# Patient Record
Sex: Female | Born: 1990 | Hispanic: No | Marital: Single | Smoking: Current some day smoker
Health system: Southern US, Community
[De-identification: ages and names within clinical notes are randomized; demographics above are authoritative.]

## PROBLEM LIST (undated history)

## (undated) DIAGNOSIS — J45909 Unspecified asthma, uncomplicated: Secondary | ICD-10-CM

## (undated) DIAGNOSIS — J40 Bronchitis, not specified as acute or chronic: Secondary | ICD-10-CM

## (undated) HISTORY — PX: TONSILLECTOMY: SUR1361

---

## 2018-03-28 ENCOUNTER — Encounter (HOSPITAL_COMMUNITY): Payer: Self-pay

## 2018-03-28 ENCOUNTER — Emergency Department (HOSPITAL_COMMUNITY): Payer: BC Managed Care – PPO

## 2018-03-28 ENCOUNTER — Other Ambulatory Visit: Payer: Self-pay

## 2018-03-28 ENCOUNTER — Emergency Department (HOSPITAL_COMMUNITY)
Admission: EM | Admit: 2018-03-28 | Discharge: 2018-03-28 | Disposition: A | Discharge status: Home / Self Care | Payer: BC Managed Care – PPO | Attending: Emergency Medicine | Admitting: Emergency Medicine

## 2018-03-28 DIAGNOSIS — J101 Influenza due to other identified influenza virus with other respiratory manifestations: Secondary | ICD-10-CM | POA: Insufficient documentation

## 2018-03-28 DIAGNOSIS — F17210 Nicotine dependence, cigarettes, uncomplicated: Secondary | ICD-10-CM | POA: Diagnosis not present

## 2018-03-28 DIAGNOSIS — J45909 Unspecified asthma, uncomplicated: Secondary | ICD-10-CM | POA: Diagnosis not present

## 2018-03-28 DIAGNOSIS — R509 Fever, unspecified: Secondary | ICD-10-CM | POA: Diagnosis present

## 2018-03-28 HISTORY — DX: Bronchitis, not specified as acute or chronic: J40

## 2018-03-28 HISTORY — DX: Unspecified asthma, uncomplicated: J45.909

## 2018-03-28 LAB — I-STAT BETA HCG BLOOD, ED (NOT ORDERABLE): I-stat hCG, quantitative: <5 m[IU]/mL (ref <5)

## 2018-03-28 LAB — COMPREHENSIVE METABOLIC PANEL
ALT: 23 U/L (ref 0–44)
AST: 24 U/L (ref 15–41)
Albumin: 3.9 g/dL (ref 3.5–5.0)
Alkaline Phosphatase: 105 U/L (ref 38–126)
Anion gap: 8 (ref 5–15)
BUN: 10 mg/dL (ref 6–20)
CO2: 23 mmol/L (ref 22–32)
Calcium: 9.1 mg/dL (ref 8.9–10.3)
Chloride: 105 mmol/L (ref 98–111)
Creatinine, Ser: 0.83 mg/dL (ref 0.44–1.00)
GFR calc Af Amer: >60 mL/min (ref >60)
Glucose, Bld: 97 mg/dL (ref 70–99)
POTASSIUM: 3.5 mmol/L (ref 3.5–5.1)
Sodium: 136 mmol/L (ref 135–145)
Total Bilirubin: 0.2 mg/dL — ABNORMAL LOW (ref 0.3–1.2)
Total Protein: 7.4 g/dL (ref 6.5–8.1)

## 2018-03-28 LAB — INFLUENZA PANEL BY PCR (TYPE A & B)
Influenza A By PCR: NEGATIVE
Influenza B By PCR: POSITIVE — AB

## 2018-03-28 LAB — URINALYSIS, ROUTINE W REFLEX MICROSCOPIC
Bilirubin Urine: NEGATIVE
Glucose, UA: NEGATIVE mg/dL
Ketones, ur: NEGATIVE mg/dL
Nitrite: NEGATIVE
Protein, ur: NEGATIVE mg/dL
Specific Gravity, Urine: 1.021 (ref 1.005–1.030)
pH: 5 (ref 5.0–8.0)

## 2018-03-28 LAB — CBC
HCT: 38.6 % (ref 36.0–46.0)
Hemoglobin: 11.7 g/dL — ABNORMAL LOW (ref 12.0–15.0)
MCH: 24.3 pg — ABNORMAL LOW (ref 26.0–34.0)
MCHC: 30.3 g/dL (ref 30.0–36.0)
MCV: 80.2 fL (ref 80.0–100.0)
PLATELETS: 304 10*3/uL (ref 150–400)
RBC: 4.81 MIL/uL (ref 3.87–5.11)
RDW: 15.7 % — ABNORMAL HIGH (ref 11.5–15.5)
WBC: 10.1 10*3/uL (ref 4.0–10.5)
nRBC: 0 % (ref 0.0–0.2)

## 2018-03-28 LAB — LIPASE, BLOOD: LIPASE: 34 U/L (ref 11–51)

## 2018-03-28 MED ORDER — SODIUM CHLORIDE 0.9 % IV BOLUS
1000.0000 mL | Freq: Once | INTRAVENOUS | Status: AC
  Administered 2018-03-28: 1000 mL via INTRAVENOUS

## 2018-03-28 MED ORDER — OSELTAMIVIR PHOSPHATE 75 MG PO CAPS: 75.0000 mg | ORAL_CAPSULE | Freq: Two times a day (BID) | ORAL | 0 refills | Status: AC

## 2018-03-28 MED ORDER — IBUPROFEN 800 MG PO TABS
800.0000 mg | ORAL_TABLET | Freq: Once | ORAL | Status: AC
  Administered 2018-03-28: 800 mg via ORAL
  Filled 2018-03-28: qty 1

## 2018-03-28 MED ORDER — SODIUM CHLORIDE 0.9% FLUSH
3.0000 mL | Freq: Once | INTRAVENOUS | Status: AC
  Administered 2018-03-28: 3 mL via INTRAVENOUS

## 2018-03-28 MED ORDER — GUAIFENESIN 100 MG/5ML PO SYRP: 100.0000 mg | ORAL_SOLUTION | ORAL | 0 refills | Status: AC | PRN

## 2018-03-28 NOTE — Discharge Instructions (Addendum)
You have been seen today for flu symptoms. Please read and follow all provided instructions.   1. Medications: Tamiflu, Robitussin for cough, usual home medications 2. Treatment: rest, drink plenty of fluids 3. Follow Up: Please follow up with your primary doctor in 2 days for discussion of your diagnoses and further evaluation after today's visit; if you do not have a primary care doctor use the resource guide provided to find one; Please return to the ER for any new or worsening symptoms. Please obtain all of your results from medical records or have your doctors office obtain the results - share them with your doctor - you should be seen at your doctors office. Call today to arrange your follow up.   Take medications as prescribed. Please review all of the medicines and only take them if you do not have an allergy to them. Return to the emergency room for worsening condition or new concerning symptoms. Follow up with your regular doctor. If you don't have a regular doctor use one of the numbers below to establish a primary care doctor.  Please be aware that if you are taking birth control pills, taking other prescriptions, ESPECIALLY ANTIBIOTICS may make the birth control ineffective - if this is the case, either do not engage in sexual activity or use alternative methods of birth control such as condoms until you have finished the medicine and your family doctor says it is OK to restart them. If you are on a blood thinner such as COUMADIN, be aware that any other medicine that you take may cause the coumadin to either work too much, or not enough - you should have your coumadin level rechecked in next 7 days if this is the case.  ?  It is also a possibility that you have an allergic reaction to any of the medicines that you have been prescribed - Everybody reacts differently to medications and while MOST people have no trouble with most medicines, you may have a reaction such as nausea, vomiting, rash,  swelling, shortness of breath. If this is the case, please stop taking the medicine immediately and contact your physician.  ?  You should return to the ER if you develop severe or worsening symptoms.   Emergency Department Resource Guide 1) Find a Doctor and Pay Out of Pocket Although you won't have to find out who is covered by your insurance plan, it is a good idea to ask around and get recommendations. You will then need to call the office and see if the doctor you have chosen will accept you as a new patient and what types of options they offer for patients who are self-pay. Some doctors offer discounts or will set up payment plans for their patients who do not have insurance, but you will need to ask so you aren't surprised when you get to your appointment.  2) Contact Your Local Health Department Not all health departments have doctors that can see patients for sick visits, but many do, so it is worth a call to see if yours does. If you don't know where your local health department is, you can check in your phone book. The CDC also has a tool to help you locate your state's health department, and many state websites also have listings of all of their local health departments.  3) Find a Walk-in Clinic If your illness is not likely to be very severe or complicated, you may want to try a walk in clinic. These are  popping up all over the country in pharmacies, drugstores, and shopping centers. They're usually staffed by nurse practitioners or physician assistants that have been trained to treat common illnesses and complaints. They're usually fairly quick and inexpensive. However, if you have serious medical issues or chronic medical problems, these are probably not your best option.  No Primary Care Doctor: Call Health Connect at  [phone] - they can help you locate a primary care doctor that  accepts your insurance, provides certain services, etc. Physician Referral [phone]  Emergency Department Resource Guide 1) Find a Doctor and Pay Out of Pocket Although you won't have to find out who is covered by your insurance plan, it is a good idea to ask around and get recommendations. You will then need to call the office and see if the doctor you have chosen will accept you as a new patient and what types of options they offer for patients who are self-pay. Some doctors offer discounts or will set up payment plans for their patients who do not have insurance, but you will need to ask so you aren't surprised when you get to your appointment.  2) Contact Your Local Health Department Not all health departments have doctors that can see patients for sick visits, but many do, so it is worth a call to see if yours does. If you don't know where your local health department is, you can check in your phone book. The CDC also has a tool to help you locate your state's health department, and many state websites also have listings of all of their local health departments.  3) Find a [facility] If your illness is not likely to be very severe or complicated, you may want to try a walk in clinic. These are popping up all over the country in pharmacies, drugstores, and shopping centers. They're usually staffed by nurse practitioners or physician assistants that have been trained to treat common illnesses and complaints. They're usually fairly quick and inexpensive. However, if you have serious medical issues or chronic medical problems, these are probably not your best option.  No Primary Care Doctor: Call Health Connect at  [phone] - they can help you locate a primary care doctor that  accepts your insurance, provides certain services, etc. Physician Referral Service- [phone]  Chronic Pain Problems: Organization         Address  Phone   Notes  [facility]  [phone] Patients need to be referred by their primary care doctor.    Medication Assistance: Organization         Address  Phone   Notes  [facility] Medication [facility] [address] [place], [place] [address] [phone] --Must be a resident of [facility] -- Must have NO insurance coverage whatsoever (no Medicaid/ Medicare, etc.) -- The pt. MUST have a primary care doctor that directs their care regularly and follows them in the community   [facility]  [phone]   [employer]  [phone]    Agencies that provide inexpensive medical care: Organization         Address  Phone   Notes  [place]  [phone]   [facility]    [phone]   [facility], [place] [address] [phone]   [place] [address] [place] [phone]   [facility]    [phone])  [facility]    [phone]   [address], [place] Phone:  [phone], Fax:  [phone] Hours of Operation:  9 am - 6 pm, M-F.  Also accepts Medicaid/Medicare and self-pay.  [facility] for [address] [address], [address], [place] Phone: [phone], Fax: [phone]. Hours of Operation:  8:30 am - 5:30 pm, M-F.  Also accepts Medicaid and self-pay.  [facility] [address] [place] Phone: [phone]   [place], [place], [place] [phone], Ext. [phone] Mondays & Thursdays: 7-9 AM.  First 15 patients are seen on a first come, first serve basis.    [place] Providers:  Organization         Address  Phone   Notes  [facility] [address], [place] [phone] Also accepts self-pay patients.  [facility] [address], [place]  [phone]   [place], [address], [place] [phone]   [facility] [address], [place] [phone]   Lucianne Lei [address], [place]   [phone] Only accepts [place] [facility] [place] patients after they have their name applied to their card.   Self-Pay (no insurance) in [facility]:  Organization         Address  Phone   Notes  Sickle Cell Patients, [facility] [place] [phone]   [facility] [place] [phone]   [facility] [place]  [place], [place], [place] [phone]   [facility]/Dr. Osei-Bonsu  [address], [place] or [address], [place] [phone] Phone number for both [place] and [place] locations is the same.  [facility] and [facility] [address] [place] [phone]   [facility] [address] [place] or [address] [phone]   [facility] [address], [place] [phone], phone; [phone], fax Sees patients 1st and 3rd Saturday of every month.  Must not qualify for public or private insurance (i.e. Medicaid, Medicare, [place] [employer], Veterans' Benefits)  Household income should be no more than 200% of the poverty level The clinic cannot treat you if you are pregnant or think you are pregnant  Sexually transmitted diseases are not treated at the clinic.

## 2018-03-28 NOTE — ED Provider Notes (Signed)
[facility]-EMERGENCY DEPT Provider Note   CSN: [id number] Arrival date & time: 03/28/18  1713     History   Chief Complaint Chief Complaint  Patient presents with  . Emesis  . Fever    HPI Denise Dean is a 28 y.o. female with a PMH of Asthma presenting with fever of 102, cough, rhinorrhea, sore throat, body aches, and congestion onset today. Patient states she had two episodes of vomiting at work today. Patient reports a mild frontal headache that she describes as pressure. Patient states headache is not different than previous headaches. Patient reports sick contacts at work. Patient denies recent travel. Patient reports taking tylenol, nyquil, and dayquil at 1pm with some relief. Patient reports taking emergen-c without relief. Patient denies abdominal pain or diarrhea. Patient denies nausea currently.   HPI  Past Medical History:  Diagnosis Date  . Asthma   . Bronchitis     There are no active problems to display for this patient.   Past Surgical History:  Procedure Laterality Date  . TONSILLECTOMY       OB History   No obstetric history on file.      Home Medications    Prior to Admission medications   Medication Sig Start Date End Date Taking? Authorizing Provider  acetaminophen (TYLENOL) 500 MG tablet Take 1,000 mg by mouth every 6 (six) hours as needed for mild pain, fever or headache.   Yes [provider]  albuterol (PROVENTIL HFA;VENTOLIN HFA) 108 (90 Base) MCG/ACT inhaler Inhale 2 puffs into the lungs every 6 (six) hours as needed for wheezing or shortness of breath.   Yes [provider]  Multiple Vitamins-Minerals (EMERGEN-C FIVE) PACK Take 1 Package by mouth as needed (flu symptoms).   Yes [provider]  guaifenesin (ROBITUSSIN) 100 MG/5ML syrup Take 5-10 mLs (100-200 mg total) by mouth every 4 (four) hours as needed for cough. 03/28/18   Carlyle Basques P, PA-C  oseltamivir (TAMIFLU) 75 MG capsule Take  1 capsule (75 mg total) by mouth every 12 (twelve) hours. 03/28/18   Leretha Dykes, PA-C    Family History No family history on file.  Social History Social History   Tobacco Use  . Smoking status: Current Some Day Smoker    Types: Cigarettes  . Smokeless tobacco: Never Used  Substance Use Topics  . Alcohol use: Yes    Comment: socially  . Drug use: Never     Allergies   Patient has no known allergies.   Review of Systems Review of Systems  Constitutional: Positive for fever. Negative for activity change, appetite change and fatigue.  HENT: Positive for congestion, rhinorrhea and sore throat. Negative for ear pain and postnasal drip.   Eyes: Negative for pain, redness, itching and visual disturbance.  Respiratory: Positive for cough and chest tightness. Negative for shortness of breath and wheezing.   Cardiovascular: Negative for chest pain.  Gastrointestinal: Positive for vomiting. Negative for abdominal pain, diarrhea and nausea.  Genitourinary: Negative for dysuria and frequency.  Musculoskeletal: Positive for myalgias. Negative for neck pain.  Skin: Negative for rash.  Allergic/Immunologic: Negative for environmental allergies.  Neurological: Positive for headaches. Negative for dizziness and weakness.    Physical Exam Updated Vital Signs BP 107/65 (BP Location: Left Arm)   Pulse 75   Temp 99.4 F (37.4 C) (Oral)   Resp 19   Ht 5\' 5"  (1.651 m)   Wt 127 kg   LMP 03/28/2018   SpO2 100%  BMI 46.59 kg/m   Physical Exam Vitals signs and nursing note reviewed.  Constitutional:      General: She is not in acute distress.    Appearance: She is well-developed. She is not diaphoretic.  HENT:     Head: Normocephalic and atraumatic.     Right Ear: Tympanic membrane, ear canal and external ear normal. No middle ear effusion.     Left Ear: Tympanic membrane, ear canal and external ear normal.  No middle ear effusion.     Nose: Mucosal edema, congestion and  rhinorrhea present.     Mouth/Throat:     Mouth: Mucous membranes are dry.     Pharynx: Uvula midline. Posterior oropharyngeal erythema present. No oropharyngeal exudate.  Eyes:     General:        Right eye: No discharge.        Left eye: No discharge.     Extraocular Movements: Extraocular movements intact.     Conjunctiva/sclera: Conjunctivae normal.     Pupils: Pupils are equal, round, and reactive to light.  Neck:     Musculoskeletal: Normal range of motion and neck supple.  Cardiovascular:     Rate and Rhythm: Normal rate and regular rhythm.     Heart sounds: Normal heart sounds. No murmur. No friction rub. No gallop.   Pulmonary:     Effort: Pulmonary effort is normal. No respiratory distress.     Breath sounds: Normal breath sounds. No wheezing or rales.  Abdominal:     Palpations: Abdomen is soft.     Tenderness: There is no abdominal tenderness. There is no guarding or rebound.  Musculoskeletal: Normal range of motion.  Lymphadenopathy:     Cervical: No cervical adenopathy.  Skin:    General: Skin is warm.     Findings: No rash.  Neurological:     Mental Status: She is alert and oriented to person, place, and time.    Mental Status:  Alert, oriented, thought content appropriate, able to give a coherent history. Speech fluent without evidence of aphasia. Able to follow 2 step commands without difficulty.  Cranial Nerves:  II:  Peripheral visual fields grossly normal, pupils equal, round, reactive to light III,IV, VI: ptosis not present, extra-ocular motions intact bilaterally  V,VII: smile symmetric, facial light touch sensation equal VIII: hearing grossly normal to voice  X: uvula elevates symmetrically  XI: bilateral shoulder shrug symmetric and strong XII: midline tongue extension without fassiculations Motor:  Normal tone. 5/5 in upper and lower extremities bilaterally including strong and equal grip strength and dorsiflexion/plantar flexion Sensory: Pinprick  and light touch normal in all extremities.  Deep Tendon Reflexes: 2+ and symmetric in the biceps and patella Cerebellar: normal finger-to-nose with bilateral upper extremities Gait: normal gait and balance.  CV: distal pulses palpable throughout    ED Treatments / Results  Labs (all labs ordered are listed, but only abnormal results are displayed) Labs Reviewed  COMPREHENSIVE METABOLIC PANEL - Abnormal; Notable for the following components:      Result Value   Total Bilirubin 0.2 (*)    All other components within normal limits  CBC - Abnormal; Notable for the following components:   Hemoglobin 11.7 (*)    MCH 24.3 (*)    RDW 15.7 (*)    All other components within normal limits  URINALYSIS, ROUTINE W REFLEX MICROSCOPIC - Abnormal; Notable for the following components:   APPearance HAZY (*)    Hgb urine dipstick LARGE (*)  Leukocytes, UA TRACE (*)    Bacteria, UA RARE (*)    All other components within normal limits  INFLUENZA PANEL BY PCR (TYPE A & B) - Abnormal; Notable for the following components:   Influenza B By PCR POSITIVE (*)    All other components within normal limits  URINE CULTURE  LIPASE, BLOOD  I-STAT BETA HCG BLOOD, ED ([facility], [facility], [facility] ONLY)  I-STAT BETA HCG BLOOD, ED (NOT ORDERABLE)    EKG None  Radiology Dg Chest 2 View  Result Date: 03/28/2018 CLINICAL DATA:  28 year old female with cough and fever. EXAM: CHEST - 2 VIEW COMPARISON:  None. FINDINGS: Normal lung volumes and mediastinal contours. Visualized tracheal air column is within normal limits. Diffuse increased pulmonary interstitial opacity in both lungs. No pneumothorax, pleural effusion or consolidation. Negative visible bowel gas pattern. Negative visible osseous structures. IMPRESSION: Diffuse pulmonary interstitial opacity compatible with acute viral/atypical respiratory infection in this clinical setting. No pleural effusion. Electronically Signed   By: Odessa FlemingH  Hall M.D.   On: 03/28/2018 18:08     Procedures Procedures (including critical care time)  Medications Ordered in ED Medications  sodium chloride flush (NS) 0.9 % injection 3 mL (3 mLs Intravenous Given 03/28/18 2019)  sodium chloride 0.9 % bolus 1,000 mL (0 mLs Intravenous Stopped 03/28/18 2119)  ibuprofen (ADVIL,MOTRIN) tablet 800 mg (800 mg Oral Given 03/28/18 2012)     Initial Impression / Assessment and Plan / ED Course  I have reviewed the triage vital signs and the nursing notes.  Pertinent labs & imaging results that were available during my care of the patient were reviewed by me and considered in my medical decision making (see chart for details).  Clinical Course as of Mar 28 2205  Wed Mar 28, 2018  1913 Diffuse pulmonary interstitial opacity present likely due to an acute viral infection. No pleural effusion noted on CXR.     DG Chest 2 View [AH]  1914 WBCs within normal limits.  WBC: 10.1 [AH]  2114 Positive for influenza B.  Influenza B By PCR(!): POSITIVE [AH]  2200 Trace of leukocytes, bacteria, and hgb noted on UA. Will order urine culture.   Leukocytes, UA(!): TRACE [AH]    Clinical Course User Index [AH] Carlyle BasquesHernandez, Terryl Molinelli P, PA-C   Patients symptoms are consistent with URI, likely viral etiology. Influenza test is positive. Provided IVF and controlled symptoms while in the ER. Pt CXR reveals diffuse pulmonary interstitial opacity consistent with a viral infection.  Discussed that antibiotics are not indicated for viral infections. Pt will be discharged with symptomatic treatment.  Discussed risks and benefits of Tamiflu. Patient requested to start Tamiflu. Verbalizes understanding and is agreeable with plan. Pt is hemodynamically stable & in NAD prior to dc.  Final Clinical Impressions(s) / ED Diagnoses   Final diagnoses:  Influenza B    ED Discharge Orders         Ordered    oseltamivir (TAMIFLU) 75 MG capsule  Every 12 hours     03/28/18 2206    guaifenesin (ROBITUSSIN) 100 MG/5ML syrup   Every 4 hours PRN     03/28/18 2206           Leretha DykesHernandez, Random Dobrowski P, [place]PA-C 03/28/18 2207    Mancel BaleWentz, Elliott, MD 03/28/18 2359

## 2018-03-28 NOTE — ED Triage Notes (Signed)
Pt states that she had a fever at work today. Pt states that she was coughing, emesis x 2, headache, congestion. Fever was 102, took day-quil, nyquil and tylenol Pt states she tried taking emergen-c's without relief.

## 2018-03-30 LAB — URINE CULTURE

## 2019-12-23 ENCOUNTER — Emergency Department (HOSPITAL_COMMUNITY)
Admission: EM | Admit: 2019-12-23 | Discharge: 2019-12-23 | Disposition: A | Discharge status: Home / Self Care | Payer: BC Managed Care – PPO | Attending: Emergency Medicine | Admitting: Emergency Medicine

## 2019-12-23 ENCOUNTER — Encounter (HOSPITAL_COMMUNITY): Payer: Self-pay

## 2019-12-23 ENCOUNTER — Emergency Department (HOSPITAL_COMMUNITY): Payer: BC Managed Care – PPO

## 2019-12-23 DIAGNOSIS — J45909 Unspecified asthma, uncomplicated: Secondary | ICD-10-CM | POA: Insufficient documentation

## 2019-12-23 DIAGNOSIS — R221 Localized swelling, mass and lump, neck: Secondary | ICD-10-CM | POA: Insufficient documentation

## 2019-12-23 DIAGNOSIS — M542 Cervicalgia: Secondary | ICD-10-CM | POA: Diagnosis present

## 2019-12-23 DIAGNOSIS — L03221 Cellulitis of neck: Secondary | ICD-10-CM | POA: Insufficient documentation

## 2019-12-23 DIAGNOSIS — F17210 Nicotine dependence, cigarettes, uncomplicated: Secondary | ICD-10-CM | POA: Insufficient documentation

## 2019-12-23 DIAGNOSIS — K029 Dental caries, unspecified: Secondary | ICD-10-CM | POA: Diagnosis not present

## 2019-12-23 DIAGNOSIS — L0390 Cellulitis, unspecified: Secondary | ICD-10-CM

## 2019-12-23 LAB — CBC WITH DIFFERENTIAL/PLATELET
Abs Immature Granulocytes: 0.03 10*3/uL (ref 0.00–0.07)
Basophils Absolute: 0.1 10*3/uL (ref 0.0–0.1)
Basophils Relative: 1 %
Eosinophils Absolute: 0.3 10*3/uL (ref 0.0–0.5)
Eosinophils Relative: 3 %
HCT: 34 % — ABNORMAL LOW (ref 36.0–46.0)
Hemoglobin: 9.7 g/dL — ABNORMAL LOW (ref 12.0–15.0)
Immature Granulocytes: 0 %
Lymphocytes Relative: 44 %
Lymphs Abs: 4.4 10*3/uL — ABNORMAL HIGH (ref 0.7–4.0)
MCH: 20.3 pg — ABNORMAL LOW (ref 26.0–34.0)
MCHC: 28.5 g/dL — ABNORMAL LOW (ref 30.0–36.0)
MCV: 71.1 fL — ABNORMAL LOW (ref 80.0–100.0)
Monocytes Absolute: 0.9 10*3/uL (ref 0.1–1.0)
Monocytes Relative: 9 %
Neutro Abs: 4.3 10*3/uL (ref 1.7–7.7)
Neutrophils Relative %: 43 %
Platelets: 455 10*3/uL — ABNORMAL HIGH (ref 150–400)
RBC: 4.78 MIL/uL (ref 3.87–5.11)
RDW: 17 % — ABNORMAL HIGH (ref 11.5–15.5)
WBC: 9.9 10*3/uL (ref 4.0–10.5)
nRBC: 0 % (ref 0.0–0.2)

## 2019-12-23 LAB — I-STAT BETA HCG BLOOD, ED (MC, WL, AP ONLY): I-stat hCG, quantitative: <5 m[IU]/mL (ref <5)

## 2019-12-23 LAB — I-STAT CHEM 8, ED
BUN: 11 mg/dL (ref 6–20)
Calcium, Ion: 1.23 mmol/L (ref 1.15–1.40)
Chloride: 104 mmol/L (ref 98–111)
Creatinine, Ser: 0.9 mg/dL (ref 0.44–1.00)
Glucose, Bld: 86 mg/dL (ref 70–99)
HCT: 33 % — ABNORMAL LOW (ref 36.0–46.0)
Hemoglobin: 11.2 g/dL — ABNORMAL LOW (ref 12.0–15.0)
Potassium: 3.9 mmol/L (ref 3.5–5.1)
Sodium: 139 mmol/L (ref 135–145)
TCO2: 27 mmol/L (ref 22–32)

## 2019-12-23 MED ORDER — AMOXICILLIN-POT CLAVULANATE 875-125 MG PO TABS: 1.0000 | ORAL_TABLET | Freq: Two times a day (BID) | ORAL | 0 refills | Status: AC

## 2019-12-23 MED ORDER — IOHEXOL 300 MG/ML  SOLN
60.0000 mL | Freq: Once | INTRAMUSCULAR | Status: AC | PRN
  Administered 2019-12-23: 60 mL via INTRAVENOUS

## 2019-12-23 NOTE — ED Triage Notes (Signed)
Pt reports 1 week of right sided neck/throat pain, pt noticed swelling that started yesterday. Pt sent from UC for further evaluation of possible abscess. Pt had a negative strept test done there. Pt maintaining secretions, some difficulty swallowing.

## 2019-12-23 NOTE — ED Provider Notes (Addendum)
[facility] EMERGENCY DEPARTMENT Provider Note   CSN: [id number] Arrival date & time: 12/23/19  1223     History Chief Complaint  Patient presents with  . Neck Pain  . Sore Throat    Khilynn Borntreger is a 29 y.o. female.  The history is provided by the patient.  Illness Location:  R angle of jaw, R neck Quality:  Pain and swelling Severity:  Moderate Onset quality:  Gradual Duration:  1 week Timing:  Constant Progression:  Worsening Chronicity:  New Relieved by:  Nothing Worsened by:  Nothing Associated symptoms: sore throat   Associated symptoms: no abdominal pain, no chest pain, no cough, no ear pain, no fever, no rash, no shortness of breath and no vomiting        Past Medical History:  Diagnosis Date  . Asthma   . Bronchitis     There are no problems to display for this patient.   Past Surgical History:  Procedure Laterality Date  . TONSILLECTOMY       OB History   No obstetric history on file.     History reviewed. No pertinent family history.  Social History   Tobacco Use  . Smoking status: Current Some Day Smoker    Types: Cigarettes  . Smokeless tobacco: Never Used  Substance Use Topics  . Alcohol use: Yes    Comment: socially  . Drug use: Never    Home Medications Prior to Admission medications   Medication Sig Start Date End Date Taking? Authorizing Provider  acetaminophen (TYLENOL) 500 MG tablet Take 1,000 mg by mouth every 6 (six) hours as needed for mild pain, fever or headache.    [provider]  albuterol (PROVENTIL HFA;VENTOLIN HFA) 108 (90 Base) MCG/ACT inhaler Inhale 2 puffs into the lungs every 6 (six) hours as needed for wheezing or shortness of breath.    [provider]  amoxicillin-clavulanate (AUGMENTIN) 875-125 MG tablet Take 1 tablet by mouth every 12 (twelve) hours for 10 days. 12/23/19 01/02/20  Loletha Carrow, MD  guaifenesin (ROBITUSSIN) 100 MG/5ML syrup Take 5-10 mLs (100-200  mg total) by mouth every 4 (four) hours as needed for cough. 03/28/18   Carlyle Basques P, PA-C  Multiple Vitamins-Minerals (EMERGEN-C FIVE) PACK Take 1 Package by mouth as needed (flu symptoms).    [provider]  oseltamivir (TAMIFLU) 75 MG capsule Take 1 capsule (75 mg total) by mouth every 12 (twelve) hours. 03/28/18   Leretha Dykes, PA-C    Allergies    Patient has no known allergies.  Review of Systems   Review of Systems  Constitutional: Negative for chills and fever.  HENT: Positive for sore throat, trouble swallowing and voice change. Negative for ear pain.   Eyes: Negative for pain and visual disturbance.  Respiratory: Negative for cough and shortness of breath.   Cardiovascular: Negative for chest pain and palpitations.  Gastrointestinal: Negative for abdominal pain and vomiting.  Genitourinary: Negative for dysuria and hematuria.  Musculoskeletal: Negative for arthralgias and back pain.  Skin: Negative for color change and rash.  Neurological: Negative for seizures and syncope.  All other systems reviewed and are negative.   Physical Exam Updated Vital Signs BP 121/83 (BP Location: Right Arm)   Pulse 63   Temp 98 F (36.7 C)   Resp 17   SpO2 98%   Physical Exam Vitals and nursing note reviewed.  Constitutional:      Appearance: She is obese. She is not ill-appearing, toxic-appearing  or diaphoretic.  HENT:     Head: Normocephalic and atraumatic.     Jaw: Tenderness (around angle of R jaw) present.     Salivary Glands: Right salivary gland is tender. Right salivary gland is not diffusely enlarged. Left salivary gland is not diffusely enlarged or tender.     Comments: Tenderness along R jaw angle with some mild induration. No fluctuance.    Right Ear: Tympanic membrane and external ear normal. No mastoid tenderness.     Left Ear: Tympanic membrane and external ear normal. No mastoid tenderness.     Mouth/Throat:     Mouth: No angioedema.     Dentition:  Dental caries present. No dental tenderness or dental abscesses.     Pharynx: Oropharynx is clear. No oropharyngeal exudate or posterior oropharyngeal erythema.     Tonsils: No tonsillar exudate or tonsillar abscesses.     Comments: Floor of mouth soft Eyes:     Conjunctiva/sclera: Conjunctivae normal.  Neck:     Thyroid: No thyroid tenderness.     Trachea: No tracheal tenderness.  Cardiovascular:     Rate and Rhythm: Normal rate and regular rhythm.     Heart sounds: No murmur heard.  No gallop.   Pulmonary:     Effort: Pulmonary effort is normal. No respiratory distress.     Breath sounds: Normal breath sounds.  Abdominal:     Palpations: Abdomen is soft.     Tenderness: There is no abdominal tenderness.  Musculoskeletal:     Cervical back: Neck supple. No edema, erythema or rigidity. Normal range of motion.  Skin:    General: Skin is warm and dry.  Neurological:     Mental Status: She is alert.     ED Results / Procedures / Treatments   Labs (all labs ordered are listed, but only abnormal results are displayed) Labs Reviewed  CBC WITH DIFFERENTIAL/PLATELET - Abnormal; Notable for the following components:      Result Value   Hemoglobin 9.7 (*)    HCT 34.0 (*)    MCV 71.1 (*)    MCH 20.3 (*)    MCHC 28.5 (*)    RDW 17.0 (*)    Platelets 455 (*)    Lymphs Abs 4.4 (*)    All other components within normal limits  I-STAT CHEM 8, ED - Abnormal; Notable for the following components:   Hemoglobin 11.2 (*)    HCT 33.0 (*)    All other components within normal limits  I-STAT BETA HCG BLOOD, ED ([facility], [facility], [facility] ONLY)    EKG None  Radiology CT Soft Tissue Neck W Contrast  Result Date: 12/23/2019 CLINICAL DATA:  Lymphadenopathy. Neck abscess. Swelling. Sore throat. EXAM: CT NECK WITH CONTRAST TECHNIQUE: Multidetector CT imaging of the neck was performed using the standard protocol following the bolus administration of intravenous contrast. CONTRAST:  42mL OMNIPAQUE IOHEXOL  300 MG/ML  SOLN COMPARISON:  None. FINDINGS: Pharynx and larynx: No mucosal or submucosal lesion. No evidence of tonsillitis or other regional inflammatory disease. Salivary glands: Parotid and submandibular glands are normal. Few small bilateral parotid lymph nodes. Thyroid: Normal Lymph nodes: No enlarged or low-density nodes on either side of the neck. Bilateral normal appearing cervical nodes. Vascular: Normal Limited intracranial: Normal Visualized orbits: Normal Mastoids and visualized paranasal sinuses: Clear Skeleton: Normal Upper chest: Normal Other: No evidence of soft tissue inflammatory disease elsewhere. IMPRESSION: Negative CT scan of the neck. No evidence of mass, lymphadenopathy, tonsillitis or other regional inflammatory disease.  Electronically Signed   By: Paulina Fusi M.D.   On: 12/23/2019 15:04    Procedures Procedures (including critical care time)  Medications Ordered in ED Medications  iohexol (OMNIPAQUE) 300 MG/ML solution 60 mL (60 mLs Intravenous Contrast Given 12/23/19 1457)    ED Course  I have reviewed the triage vital signs and the nursing notes.  Pertinent labs & imaging results that were available during my care of the patient were reviewed by me and considered in my medical decision making (see chart for details).    MDM Rules/Calculators/A&P                          The patient is a 29yo female, PMH asthma who presents to the ED from urgent care for concern for abscess.  On my initial evaluation, the patient is hemodynamically stable, afebrile, nontoxic-appearing. Physical exam remarkable for R neck and angle of jaw tenderness, induration.  Differentials considered include Ludwigs, PTA, RPA, parotitis, dental abscess, mastoiditis, otitis. CT neck obtained prior to my evaluation unremarkable, no evidence of drainable abscesses, RPA, Ludwig's per my interpretation as well as per radiology evaluation.  Labs unremarkable, no leukocytosis, patient is anemic but  does not require transfusion.  With reassuring CT scan, low suspicion for other emergent etiology.  Patient tolerating secretions on my evaluation.  Patient was tender over the right parotid gland area, swelling possibly just some lymphadenopathy or some parotid inflammation.  Patient reports swelling is actually improved since yesterday.  Will treat with antibiotics and recommend outpatient ENT follow-up.  Advised patient of unclear etiology of symptoms. Advised treatment of symptoms with Augmentin, which were prescribed. Recommended follow-up with PCP in the next couple days, and provided contact information for ENT follow-up.  Work note provided.  Strict return precautions provided. Patient discharged in stable condition.   The care of this patient was overseen by Dr. Rosalia Hammers, who agreed with evaluation and plan of care.   Final Clinical Impression(s) / ED Diagnoses Final diagnoses:  Cellulitis, unspecified cellulitis site  Neck swelling    Rx / DC Orders ED Discharge Orders         Ordered    amoxicillin-clavulanate (AUGMENTIN) 875-125 MG tablet  Every 12 hours        12/23/19 1603            Loletha Carrow, MD 12/23/19 1616    Margarita Grizzle, MD 12/24/19 1320

## 2021-12-09 IMAGING — CT CT NECK W/ CM
5 of 6 series · 14 of 35 positions shown, 16 images · IV contrast (APPLIED)
Comparison: None.

CLINICAL DATA: Lymphadenopathy. Neck abscess. Swelling. Sore
throat.

EXAM:
CT NECK WITH CONTRAST
TECHNIQUE: Multidetector CT imaging of the neck was performed using the
standard protocol following the bolus administration of intravenous
contrast.
CONTRAST:  60mL OMNIPAQUE IOHEXOL 300 MG/ML  SOLN

[Series 3: axial neck · axial · 0.59mm/px · z∈[+1105,+1185]mm · 2 of 122 slices shown, 3 images]
[im 41/122  soft-tissue]
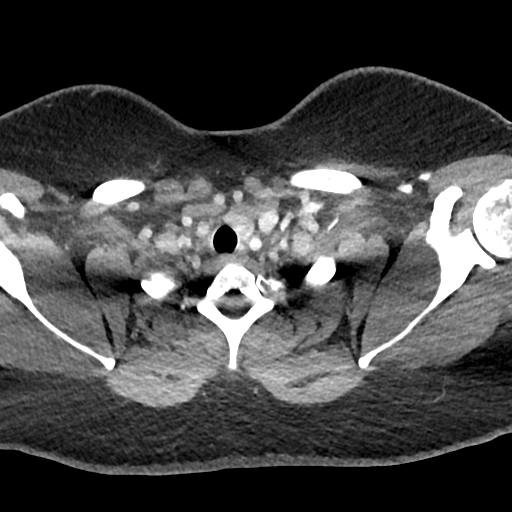
[im 41/122  bone]
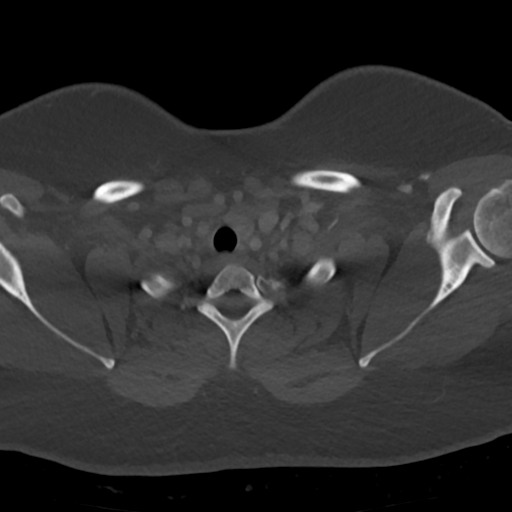
[im 81/122  bone]
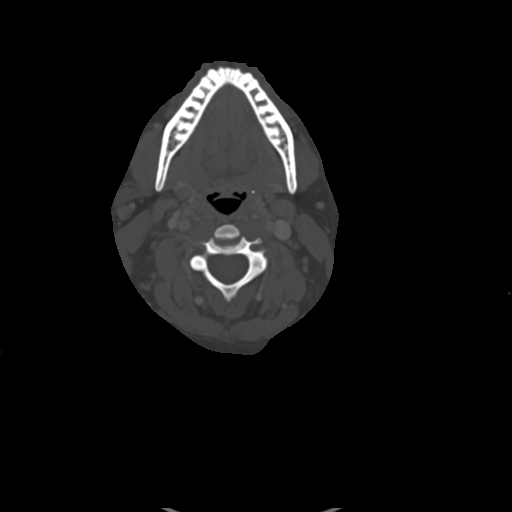

[Series 5: axial bone · axial · 0.59mm/px · z∈[+1105,+1185]mm · 2 of 122 slices shown]
[im 41/122  bone]
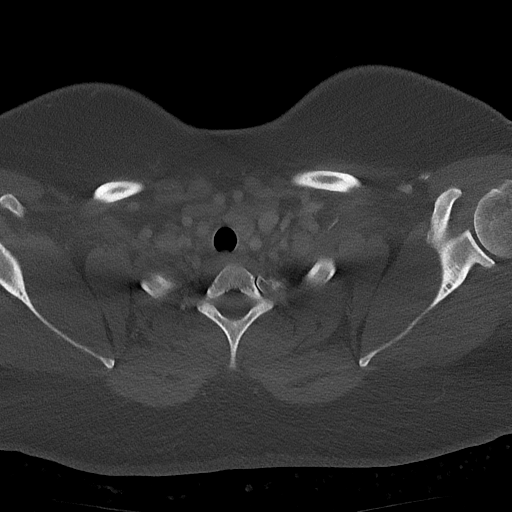
[im 81/122  bone]
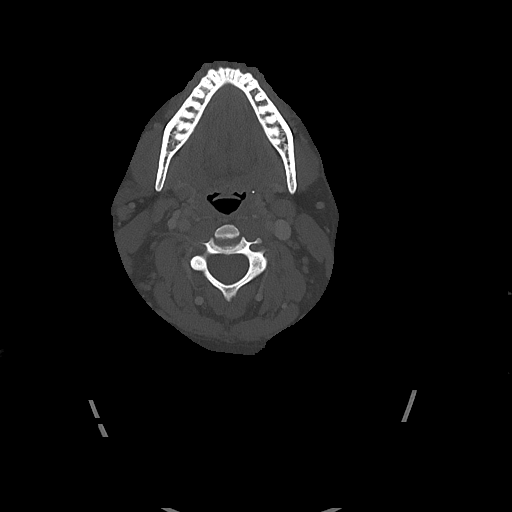

[Series 6: sag neck · sagittal · 0.49mm/px · 5 of 249 slices shown, 6 images]
[im 83/249  bone]
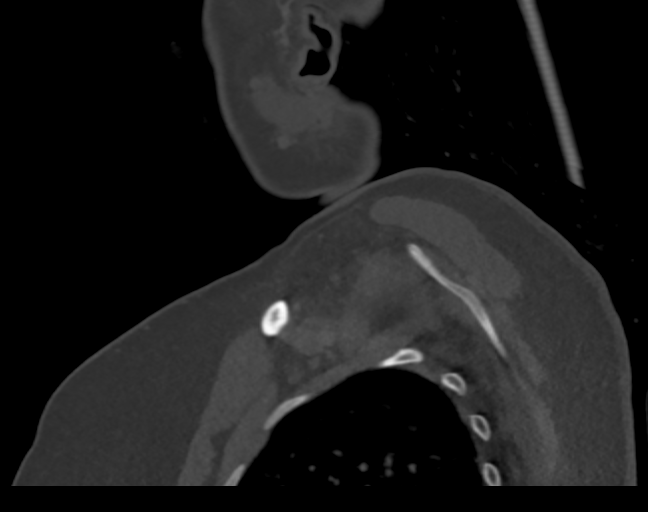
[im 104/249  bone]
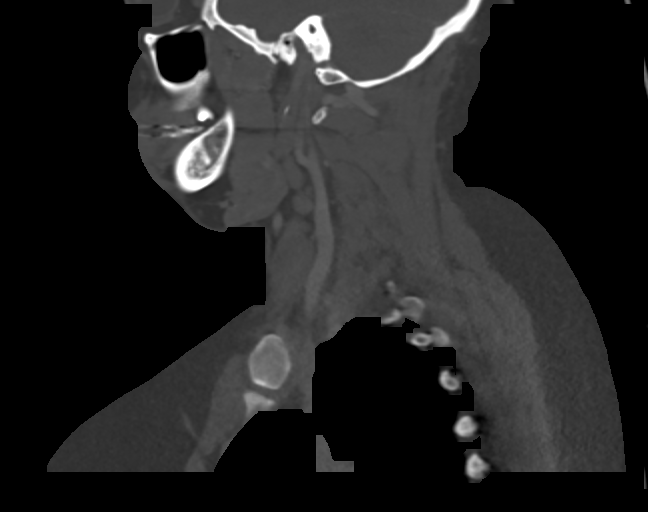
[im 125/249  soft-tissue]
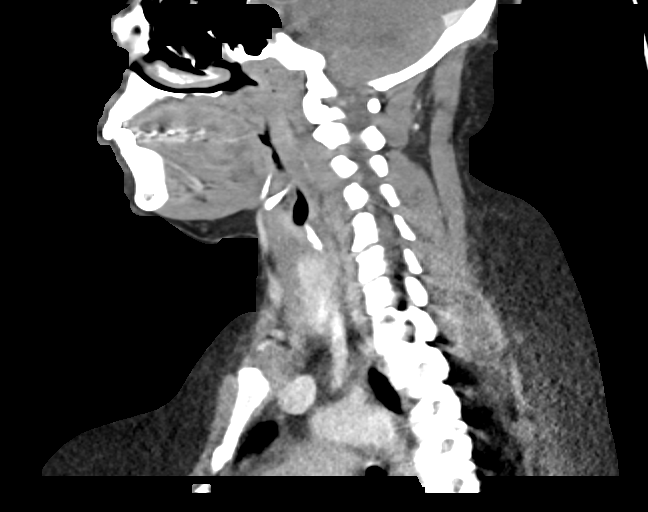
[im 125/249  bone]
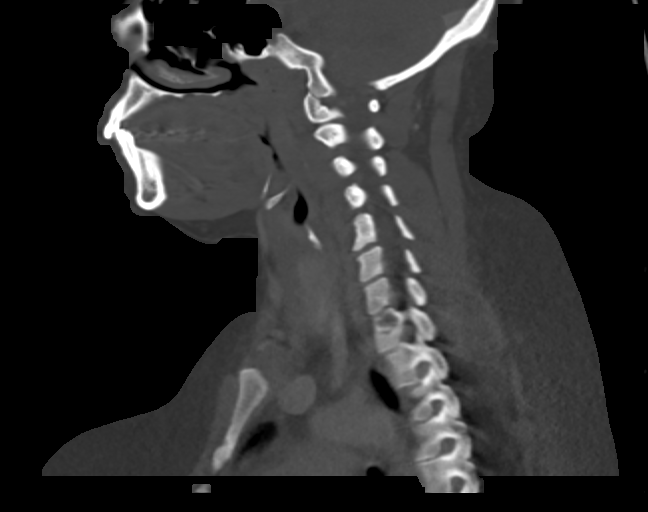
[im 145/249  bone]
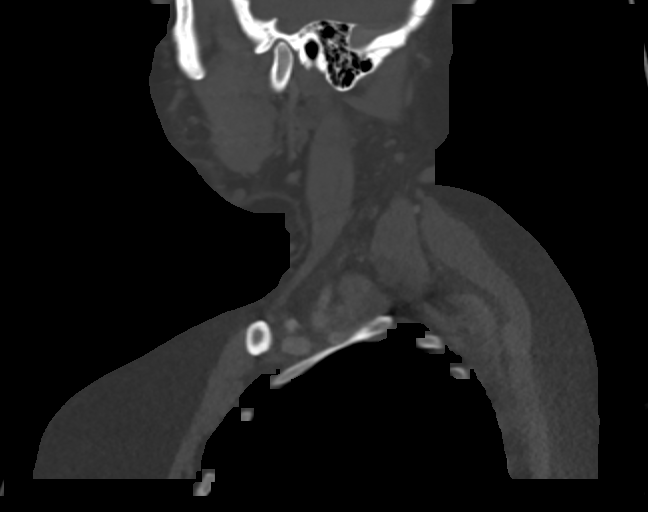
[im 166/249  bone]
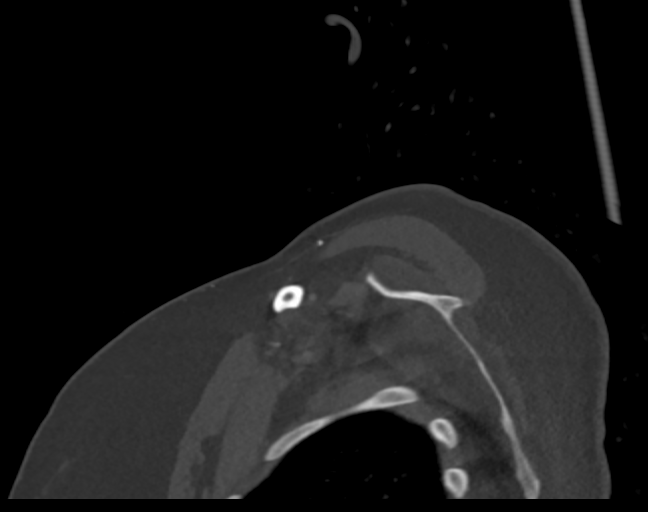

[Series 7: cor neck · coronal · 0.49mm/px · 3 of 158 slices shown]
[im 32/158  bone]
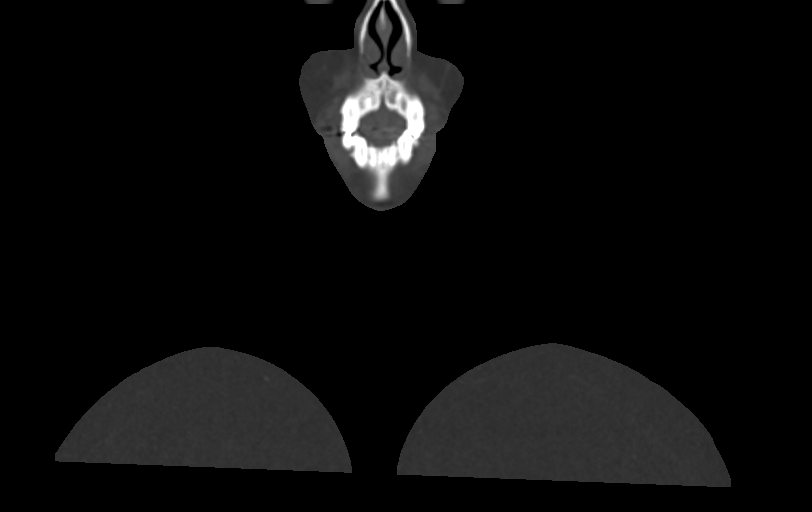
[im 63/158  bone]
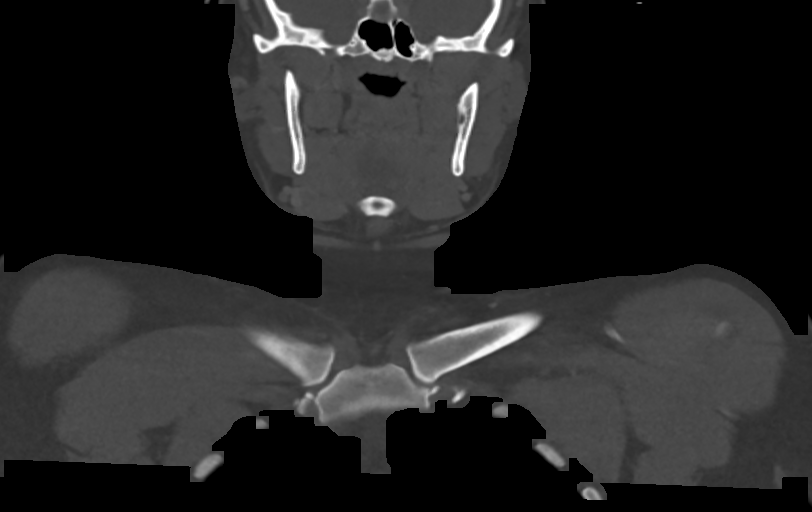
[im 95/158  bone]
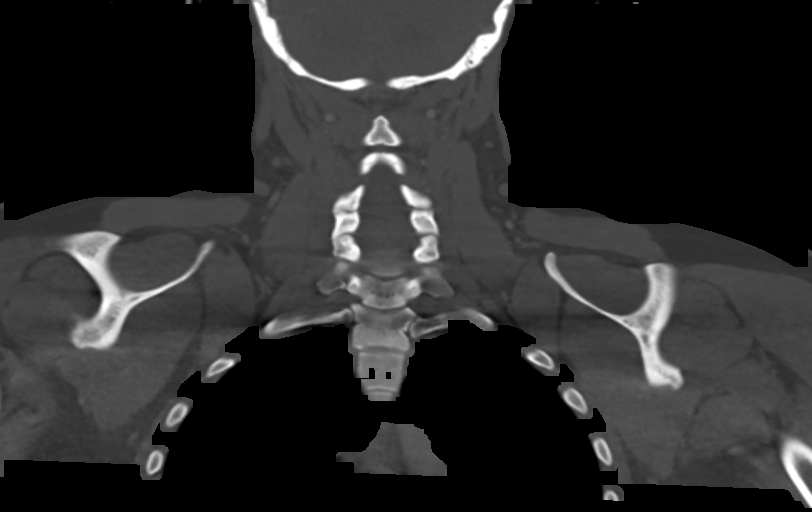

[Series 8: ax oropharynx · axial · 0.62mm/px · z∈[+1091,+1173]mm · 2 of 125 slices shown]
[im 42/125  bone]
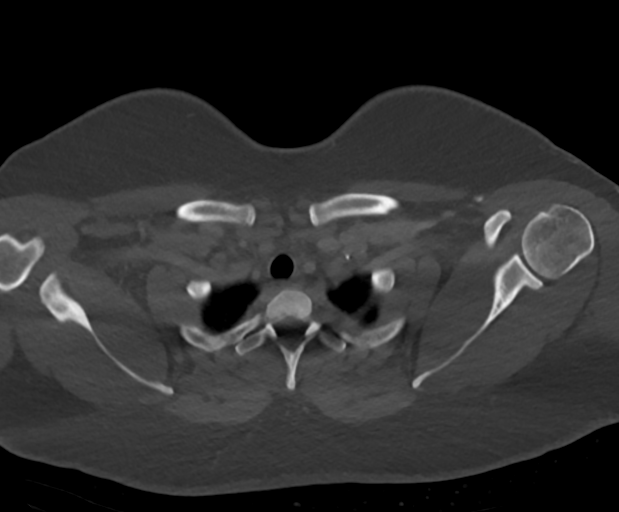
[im 83/125  bone]
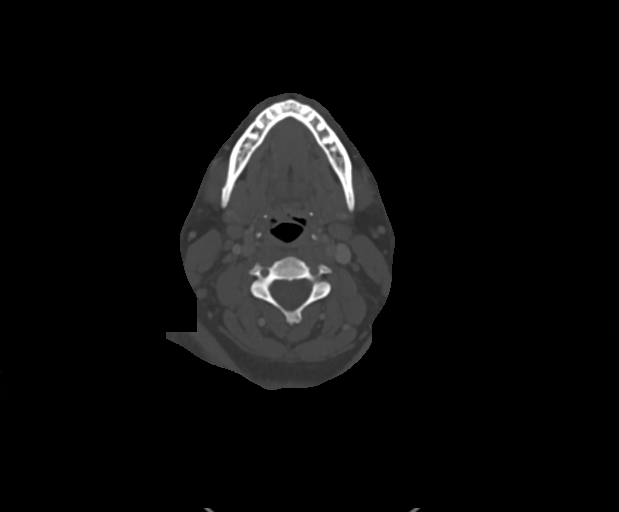

[14 of 35 positions shown; findings below may reference images not displayed]

FINDINGS: Pharynx and larynx: No mucosal or submucosal lesion. No evidence of
tonsillitis or other regional inflammatory disease.

Salivary glands: Parotid and submandibular glands are normal. Few
small bilateral parotid lymph nodes.

Thyroid: Normal

Lymph nodes: No enlarged or low-density nodes on either side of the
neck. Bilateral normal appearing cervical nodes.

Vascular: Normal

Limited intracranial: Normal

Visualized orbits: Normal

Mastoids and visualized paranasal sinuses: Clear

Skeleton: Normal

Upper chest: Normal

Other: No evidence of soft tissue inflammatory disease elsewhere.
IMPRESSION: Negative CT scan of the neck. No evidence of mass, lymphadenopathy,
tonsillitis or other regional inflammatory disease.

## 2023-01-07 ENCOUNTER — Other Ambulatory Visit: Payer: Self-pay

## 2023-01-07 ENCOUNTER — Emergency Department (HOSPITAL_BASED_OUTPATIENT_CLINIC_OR_DEPARTMENT_OTHER)
Admission: EM | Admit: 2023-01-07 | Discharge: 2023-01-07 | Disposition: A | Discharge status: Home / Self Care | Payer: BC Managed Care – PPO | Attending: Emergency Medicine | Admitting: Emergency Medicine

## 2023-01-07 DIAGNOSIS — L0231 Cutaneous abscess of buttock: Secondary | ICD-10-CM | POA: Insufficient documentation

## 2023-01-07 MED ORDER — LIDOCAINE HCL (PF) 1 % IJ SOLN
INTRAMUSCULAR | Status: AC
  Administered 2023-01-07: 5 mL via INTRADERMAL
  Filled 2023-01-07: qty 5

## 2023-01-07 MED ORDER — AMOXICILLIN-POT CLAVULANATE 875-125 MG PO TABS
1.0000 | ORAL_TABLET | Freq: Once | ORAL | Status: AC
  Administered 2023-01-07: 1 via ORAL
  Filled 2023-01-07: qty 1

## 2023-01-07 MED ORDER — AMOXICILLIN-POT CLAVULANATE 500-125 MG PO TABS: 1.0000 | ORAL_TABLET | Freq: Three times a day (TID) | ORAL | 0 refills | Status: AC

## 2023-01-07 NOTE — Discharge Instructions (Addendum)
Begin taking Augmentin as prescribed.  Apply warm compresses or perform sitz bath's as frequently as possible for the next several days.  Return to the emergency department if you develop worsening swelling, high fevers, worsening pain, or for other new and concerning symptoms.

## 2023-01-07 NOTE — ED Provider Notes (Signed)
[place] EMERGENCY DEPARTMENT AT [facility] Provider Note   CSN: [id number] Arrival date & time: 01/07/23  2247     History  Chief Complaint  Patient presents with   Abscess    Denise Dean is a 32 y.o. female.  Patient is a 32 year old female presenting with complaints of a left buttock abscess.  For the past several days she has had increased swelling and pain in the tissues near her rectum.  She has tried bathing and Epsom salts, however this has not helped.  No fevers or chills.  The history is provided by the patient.       Home Medications Prior to Admission medications   Medication Sig Start Date End Date Taking? Authorizing Provider  acetaminophen (TYLENOL) 500 MG tablet Take 1,000 mg by mouth every 6 (six) hours as needed for mild pain, fever or headache.    [provider]  albuterol (PROVENTIL HFA;VENTOLIN HFA) 108 (90 Base) MCG/ACT inhaler Inhale 2 puffs into the lungs every 6 (six) hours as needed for wheezing or shortness of breath.    [provider]  guaifenesin (ROBITUSSIN) 100 MG/5ML syrup Take 5-10 mLs (100-200 mg total) by mouth every 4 (four) hours as needed for cough. 03/28/18   Carlyle Basques P, PA-C  Multiple Vitamins-Minerals (EMERGEN-C FIVE) PACK Take 1 Package by mouth as needed (flu symptoms).    [provider]  oseltamivir (TAMIFLU) 75 MG capsule Take 1 capsule (75 mg total) by mouth every 12 (twelve) hours. 03/28/18   Leretha Dykes, PA-C      Allergies    Patient has no known allergies.    Review of Systems   Review of Systems  All other systems reviewed and are negative.   Physical Exam Updated Vital Signs BP (!) 153/75   Pulse (!) 110   Temp 98.2 F (36.8 C) (Oral)   Resp 18   Ht 5\' 5"  (1.651 m)   Wt 125.6 kg   SpO2 100%   BMI 46.10 kg/m  Physical Exam Vitals and nursing note reviewed.  Constitutional:      Appearance: Normal appearance.  Pulmonary:     Effort: Pulmonary effort is  normal.  Genitourinary:    Comments: To the inside of the left buttock, but remote from the rectum is a 3 cm, round, swollen, fluctuant, tender area consistent with abscess. Skin:    General: Skin is warm and dry.  Neurological:     Mental Status: She is alert.     ED Results / Procedures / Treatments   Labs (all labs ordered are listed, but only abnormal results are displayed) Labs Reviewed - No data to display  EKG None  Radiology No results found.  Procedures Procedures   INCISION AND DRAINAGE Performed by: Geoffery Lyons Consent: Verbal consent obtained. Risks and benefits: risks, benefits and alternatives were discussed Type: abscess  Body area: left buttock  Anesthesia: local infiltration  Incision was made with a scalpel.  Local anesthetic: lidocaine 1% without epinephrine  Anesthetic total: 3 ml  Complexity: complex Blunt dissection to break up loculations  Drainage: purulent  Drainage amount: moderate  Packing material: no packing placed  Patient tolerance: Patient tolerated the procedure well with no immediate complications.    Medications Ordered in ED Medications  lidocaine (PF) (XYLOCAINE) 1 % injection (has no administration in time range)    ED Course/ Medical Decision Making/ A&P  Patient presenting with a buttock abscess.  This was incised and drained as above.  Patient will be discharged with antibiotics, sitz bath's, and follow-up as needed.  Final Clinical Impression(s) / ED Diagnoses Final diagnoses:  None    Rx / DC Orders ED Discharge Orders     None         Geoffery Lyons, MD 01/07/23 2326

## 2023-01-07 NOTE — ED Triage Notes (Signed)
Painful sore right buttock. Has had now for 3 days. No discharge. Has been using epsom baths with no relief. Afebrile. Denies N/V, fatigue.
